# Patient Record
Sex: Female | Born: 1985 | Race: Black or African American | Hispanic: No | Marital: Single | Smoking: Never smoker
Health system: Southern US, Community
[De-identification: ages and names within clinical notes are randomized; demographics above are authoritative.]

---

## 2015-12-21 ENCOUNTER — Emergency Department (HOSPITAL_COMMUNITY): Payer: Self-pay

## 2015-12-21 ENCOUNTER — Emergency Department (HOSPITAL_COMMUNITY)
Admission: EM | Admit: 2015-12-21 | Discharge: 2015-12-21 | Disposition: A | Discharge status: Home / Self Care | Payer: Self-pay | Attending: Emergency Medicine | Admitting: Emergency Medicine

## 2015-12-21 ENCOUNTER — Encounter (HOSPITAL_COMMUNITY): Payer: Self-pay

## 2015-12-21 DIAGNOSIS — M7989 Other specified soft tissue disorders: Secondary | ICD-10-CM | POA: Insufficient documentation

## 2015-12-21 DIAGNOSIS — M79644 Pain in right finger(s): Secondary | ICD-10-CM | POA: Insufficient documentation

## 2015-12-21 MED ORDER — NAPROXEN 250 MG PO TABS
500.0000 mg | ORAL_TABLET | Freq: Once | ORAL | Status: AC
  Administered 2015-12-21: 500 mg via ORAL
  Filled 2015-12-21: qty 2

## 2015-12-21 MED ORDER — NAPROXEN 500 MG PO TABS: 500.0000 mg | ORAL_TABLET | Freq: Two times a day (BID) | ORAL | Status: AC

## 2015-12-21 NOTE — ED Notes (Addendum)
error 

## 2015-12-21 NOTE — Discharge Instructions (Signed)

## 2015-12-21 NOTE — ED Notes (Signed)
Returned from xray

## 2015-12-21 NOTE — ED Provider Notes (Signed)
CSN: [id number]     Arrival date & time 12/21/15  1159 History  By signing my name below, I, Tanda RockersMargaux Venter, attest that this documentation has been prepared under the direction and in the presence of Cheri FowlerKayla Ellard Nan, PA-C. Electronically Signed: Tanda RockersMargaux Venter, ED Scribe. 12/21/2015. 1:20 PM.   Chief Complaint  Patient presents with  . Finger Injury   The history is provided by the patient. No language interpreter was used.    HPI Comments: Kathy Lloyd is a 30 y.o. female who presents to the Emergency Department complaining of gradual onset, constant, dull right 5th finger pain radiating up to right elbow x 1 week. Pt also complains of swelling to the finger. No known injury to the finger. The pain is exacerbated with movement. She has not taken anything for the pain. Pt mentions having surgery to repair fracture to right hand last year. Denies weakness, numbness, or any other associated symptoms.   History reviewed. No pertinent past medical history. History reviewed. No pertinent past surgical history. No family history on file. Social History  Substance Use Topics  . Smoking status: Never Smoker   . Smokeless tobacco: None  . Alcohol Use: None   OB History    No data available     Review of Systems  Musculoskeletal: Positive for joint swelling and arthralgias.  Neurological: Negative for weakness and numbness.   Allergies  Review of patient's allergies indicates no known allergies.  Home Medications   Prior to Admission medications   Medication Sig Start Date End Date Taking? Authorizing Provider  naproxen (NAPROSYN) 500 MG tablet Take 1 tablet (500 mg total) by mouth 2 (two) times daily. 12/21/15   Aragorn Recker, PA-C   BP 126/64 mmHg  Pulse 85  Temp(Src) 98.2 F (36.8 C) (Oral)  Resp 18  SpO2 100%  LMP 12/19/2015   Physical Exam  Constitutional: She is oriented to person, place, and time. She appears well-developed and well-nourished.  HENT:  Head: Normocephalic  and atraumatic.  Eyes: Conjunctivae are normal.  Neck: Normal range of motion.  Cardiovascular:  Pulses:      Radial pulses are 2+ on the right side, and 2+ on the left side.  Capillary refill less than 3 seconds.   Pulmonary/Chest: Effort normal. No respiratory distress.  Abdominal: She exhibits no distension.  Musculoskeletal: She exhibits tenderness.  Mild swelling to PIPJ of right 5th finger.  No erythema, warmth, or infection.  Slightly decreased ROM in flexion 2/2 to pain.  Neurological: She is alert and oriented to person, place, and time.  Strength and sensation intact.  Skin: Skin is warm and dry.    ED Course  Procedures (including critical care time)  DIAGNOSTIC STUDIES: Oxygen Saturation is 100% on RA, normal by my interpretation.    COORDINATION OF CARE: 1:19 PM-Discussed treatment plan which includes Naprosyn and splint with pt at bedside and pt agreed to plan.   Labs Review Labs Reviewed - No data to display  Imaging Review Dg Hand Complete Right  12/21/2015  CLINICAL DATA:  Recent fall with wrist and hand pain, initial encounter EXAM: RIGHT HAND - COMPLETE 3+ VIEW COMPARISON:  None. FINDINGS: Mild deformity of the fifth metacarpal is noted consistent with the patient's given clinical history of prior fracture with fixation. No acute fracture or dislocation is noted. No soft tissue abnormality is seen. IMPRESSION: No acute abnormality noted. Electronically Signed   By: Alcide CleverMark  Lukens M.D.   On: 12/21/2015 13:53   I have personally  reviewed and evaluated these images and lab results as part of my medical decision-making.   EKG Interpretation None      MDM   Final diagnoses:  Finger pain, right    Patient X-Ray negative for obvious fracture or dislocation.  No systemic symptoms.  Pt advised to follow up with orthopedics. Patient given splint while in ED, conservative therapy recommended and discussed. Patient will be discharged home & is agreeable with above  plan. Returns precautions discussed. Pt appears safe for discharge.  I personally performed the services described in this documentation, which was scribed in my presence. The recorded information has been reviewed and is accurate.      Cheri Fowler, PA-C 12/21/15 1407  Cathren Laine, MD 12/21/15 [phone]

## 2015-12-21 NOTE — ED Notes (Signed)
Patient here with right hand little finger pain x 1 week, states she has had surgery in past for same. denies any injury, no swelling noted.

## 2018-02-20 IMAGING — CR DG HAND COMPLETE 3+V*R*
3 series · 3 of 3 positions shown · non-contrast
Comparison: None.

CLINICAL DATA: Recent fall with wrist and hand pain, initial
encounter

EXAM:
RIGHT HAND - COMPLETE 3+ VIEW

[hand pa]
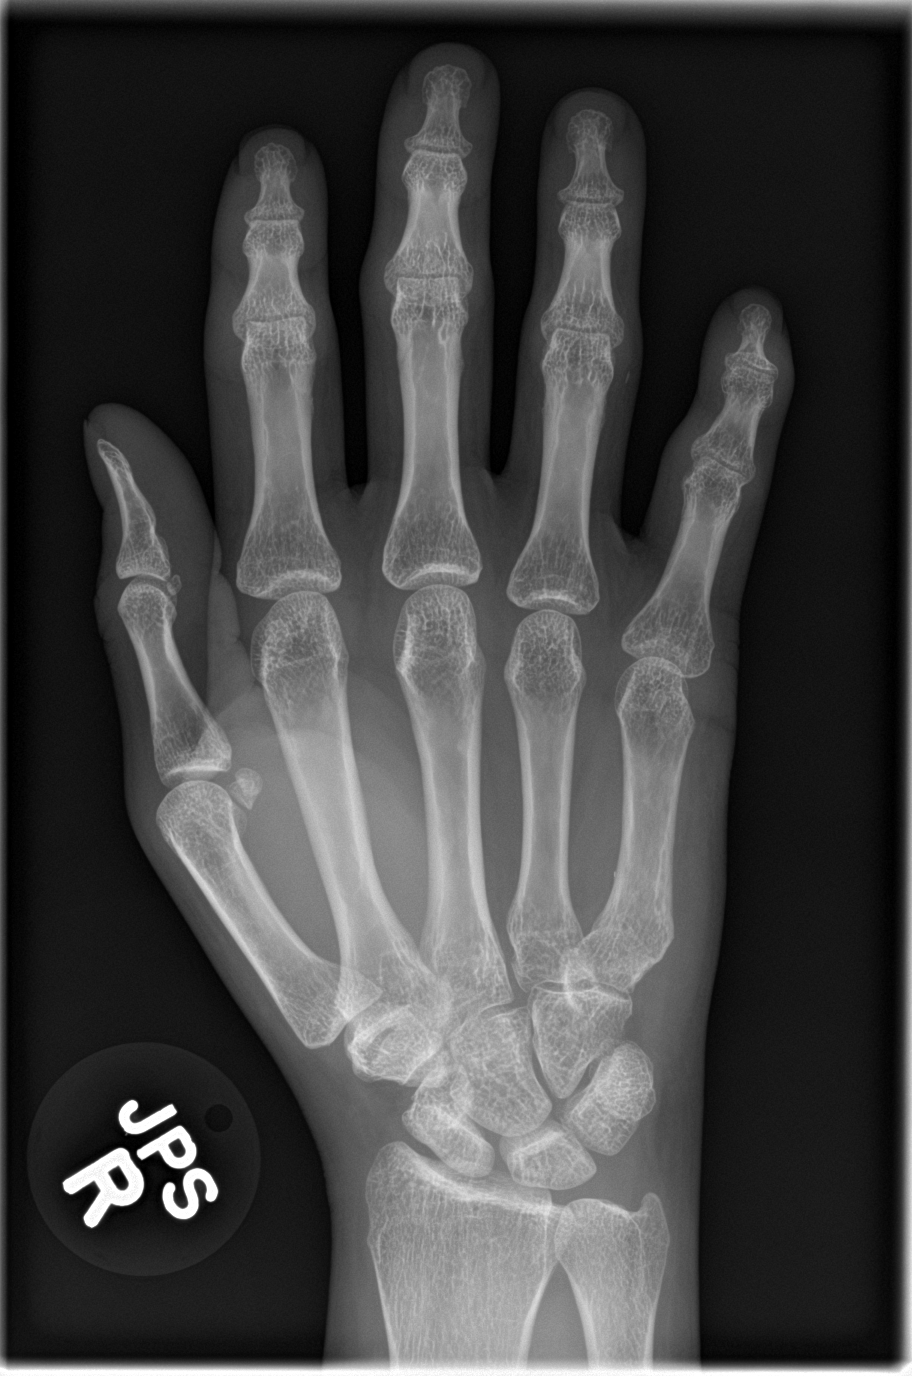

[hand obl]
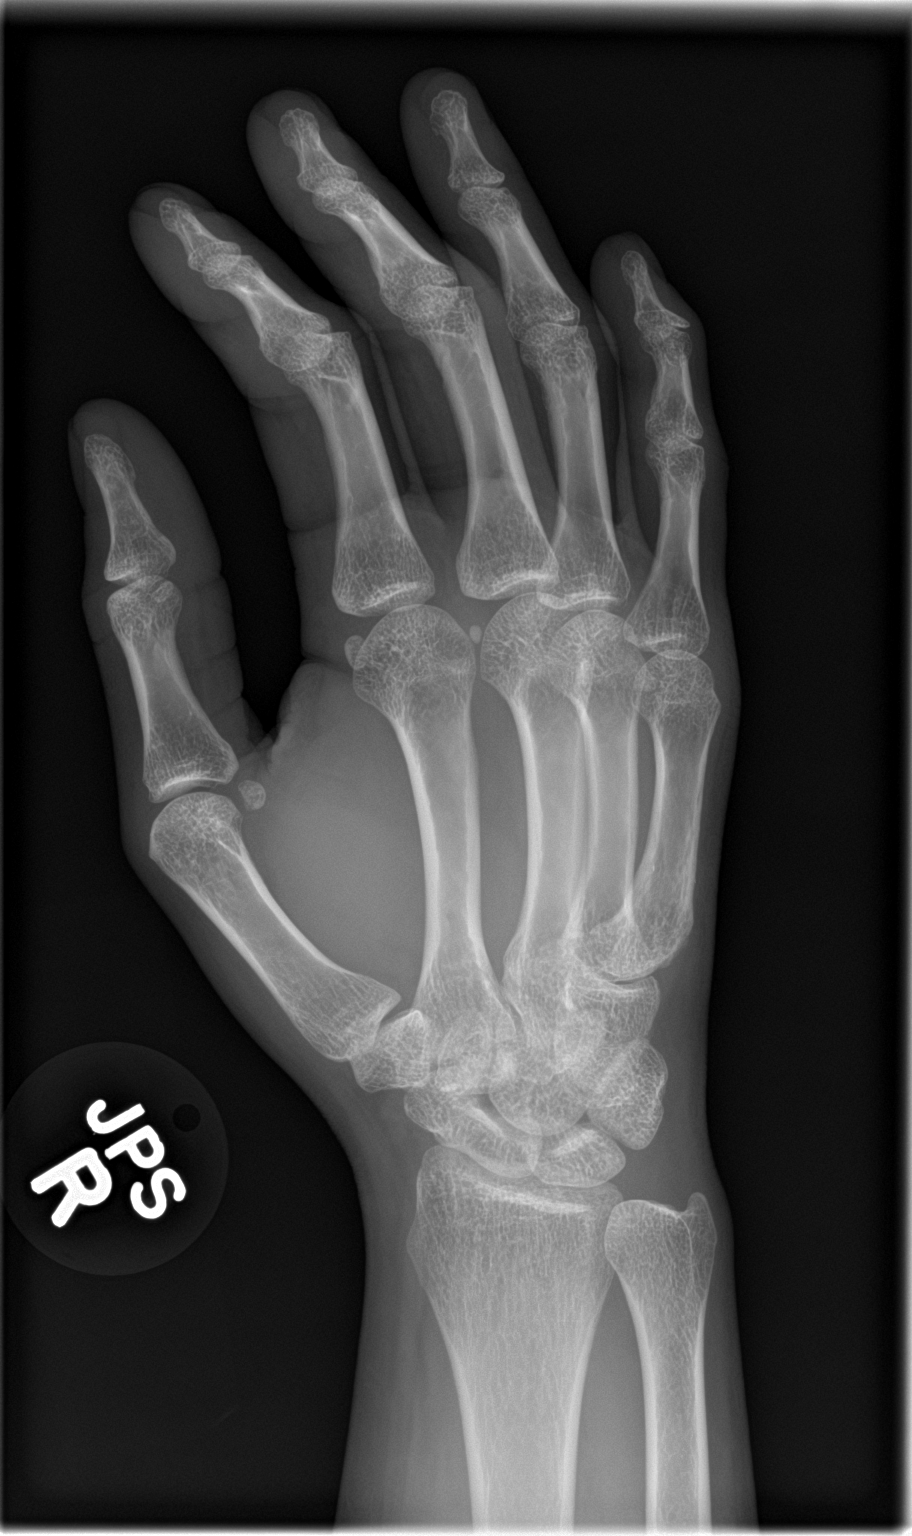

[hand lat]
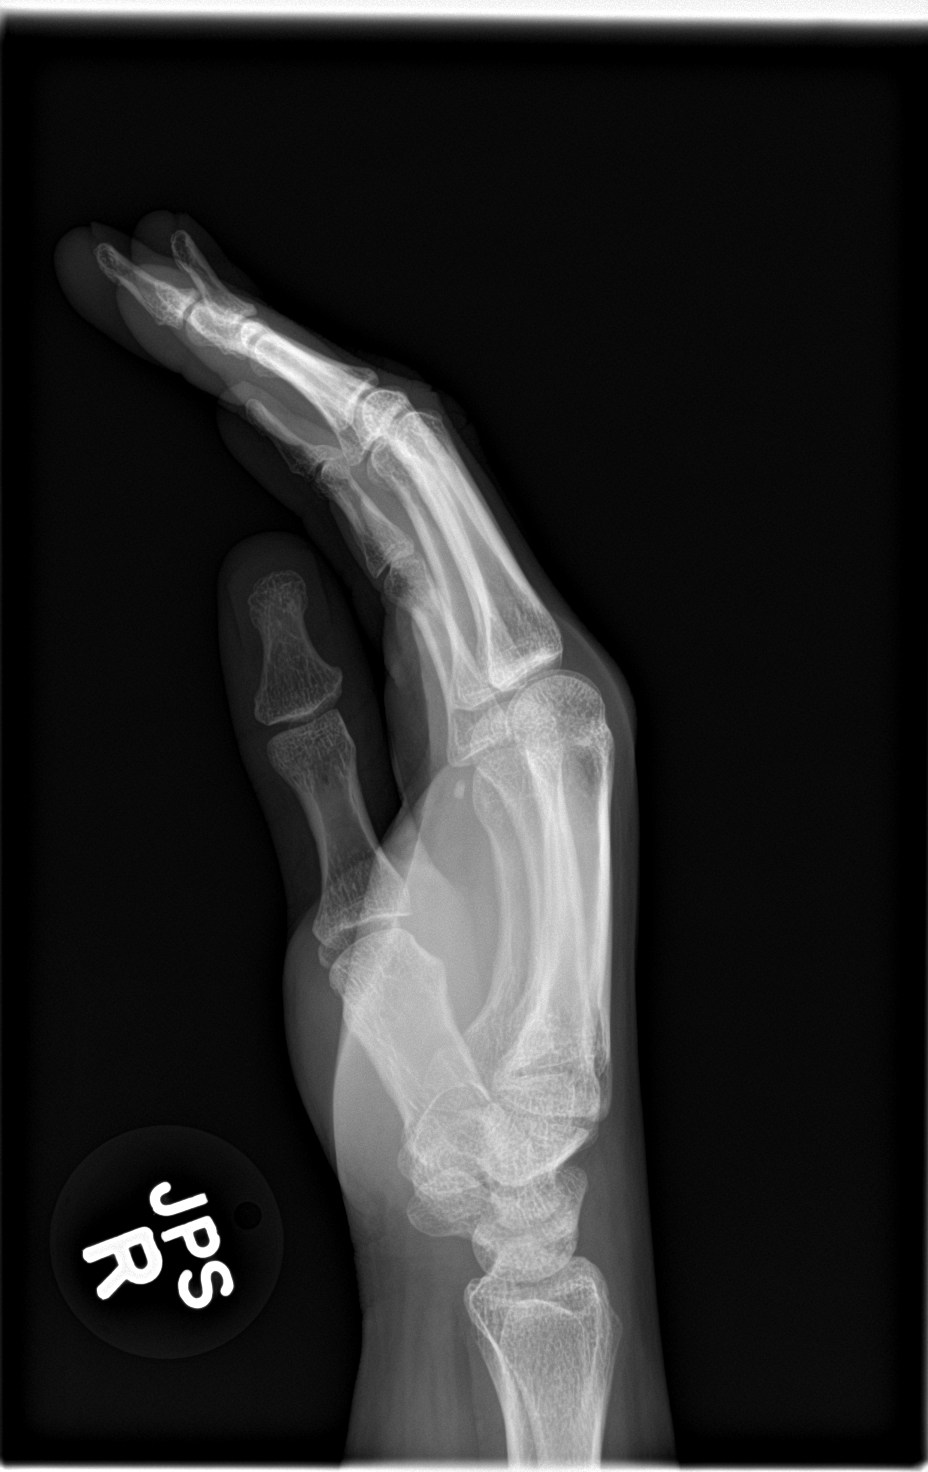

[3 of 3 positions shown; findings below may reference images not displayed]

FINDINGS: Mild deformity of the fifth metacarpal is noted consistent with the
patient's given clinical history of prior fracture with fixation. No
acute fracture or dislocation is noted. No soft tissue abnormality
is seen.
IMPRESSION: No acute abnormality noted.
